# Patient Record
Sex: Female | Born: 1955 | Race: White | Hispanic: No
Health system: Southern US, Academic
[De-identification: ages and names within clinical notes are randomized; demographics above are authoritative.]

## PROBLEM LIST (undated history)

## (undated) DIAGNOSIS — I639 Cerebral infarction, unspecified: Secondary | ICD-10-CM

## (undated) DIAGNOSIS — G43909 Migraine, unspecified, not intractable, without status migrainosus: Secondary | ICD-10-CM

## (undated) DIAGNOSIS — J449 Chronic obstructive pulmonary disease, unspecified: Secondary | ICD-10-CM

## (undated) DIAGNOSIS — I10 Essential (primary) hypertension: Secondary | ICD-10-CM

## (undated) HISTORY — DX: Migraine, unspecified, not intractable, without status migrainosus: G43.909

## (undated) HISTORY — DX: Cerebral infarction, unspecified (CMS HCC): I63.9

## (undated) HISTORY — DX: Essential (primary) hypertension: I10

## (undated) HISTORY — DX: Chronic obstructive pulmonary disease, unspecified (CMS HCC): J44.9

## (undated) NOTE — Telephone Encounter (Signed)
Formatting of this note is different from the original.  Situation:   Received live call from Debbie    Relationship: self  Caller's contact number: [phone]   MyChart Active? No    Caller Concern/Issue &Assessment:     Patient requesting an appt with Karlie for bilateral knee steroid injection.     Patient prefers the [facility] office.     Background:   If existing patient/problem, date of last interaction and type (enter "NA" if not applicable): 12/20/2021    Assessment:   Right knee              Chondromalacia / osteoarthritis              Patellofemoral syndrome              Quad weakness    Left knee              Chondromalacia / osteoarthritis              Patella contusion              Quad weakness    Frequent falls      Plan:   -We discussed diagnosis, prognosis, natural history, and treatment options  -We discussed conservative management versus operative intervention  -Discussed conservative management options with physical therapy, at-home physical therapy exercises, antiinflammatories, corticosteroid injections, and activity modifications  -Discussed importance of and strategies for at-home physical therapy exercises for quad, hamstring, and hip strengthening  -Naprosyn BID with meals for pain/inflammation, adding tylenol as needed - discussed taking consistently for improved effect  -Discussed risks and benefits of corticosteroid injections  -We discussed icing therapy   -Physical Therapy consult with [facility] in [place] for quad, hamstring, and hip strengthening      -Pt is electing to receive bilateral knee CSI and for conservative management that includes PT, HEP's, and anti-inflammatories      Patient elected for continued conservative management with a corticosteroid injection for primary knee osteoarthritis    Recommendation:   Patient aware of appt date,time,and location--CNRV #6    Terance Ice, RN 04/12/2022 12:38 PM   Electronically signed by Terance Ice, RN at 04/12/2022  12:42 PM EDT

---

## 1988-08-17 ENCOUNTER — Other Ambulatory Visit (HOSPITAL_COMMUNITY): Payer: Self-pay | Admitting: OBSTETRICS/GYNECOLOGY

## 2021-03-16 ENCOUNTER — Ambulatory Visit (INDEPENDENT_AMBULATORY_CARE_PROVIDER_SITE_OTHER): Payer: Self-pay | Admitting: NURSE PRACTITIONER

## 2021-03-16 NOTE — Telephone Encounter (Signed)
-----   Message from Zebedee Iba, Ambulatory Care Assistant sent at 03/16/2021  4:22 PM EDT -----  ----- Message from Maisie Fus, CA sent at 03/16/2021  4:16 PM EDT -----  Previous [facility] pt:  Pt was seeing Dr. Campbell Riches for melanoma. She let me schedule an appt with Dr. Vena Austria for her, but she is insistent that someone fill her pain prescription before she runs out this weekend. She is asking for a call ASAP.  Thank you.

## 2021-03-17 ENCOUNTER — Ambulatory Visit (INDEPENDENT_AMBULATORY_CARE_PROVIDER_SITE_OTHER): Payer: Self-pay | Admitting: NURSE PRACTITIONER

## 2021-03-20 NOTE — Telephone Encounter (Signed)
I called and spoke with patient about the pain medication.  She was advised that [facility] only prescribes pain medication for active cancer patient.   She states that she just had surgery on both her legs for skin cancer.   She was advised to speak with the provider who did her surgery removal about pain medication.  She would have to be seen to determine if her pain medication is appropriate but that having a history of Melanoma dose not quailify for pain management.     She was not happy with this.  She was advised to reach out to the provider who did her skin cancer removal.    She had been told at Dr. Tobin Chad office that she would need to find a pain clinic and was given pain clinic packet.

## 2021-03-31 ENCOUNTER — Ambulatory Visit (INDEPENDENT_AMBULATORY_CARE_PROVIDER_SITE_OTHER): Payer: Self-pay | Admitting: Hematology & Oncology

## 2022-04-11 IMAGING — MR MRI BRAIN W/O CONTRAST
9 series · 48 of 48 positions shown · non-contrast
Comparison: None available.

﻿EXAM:  MRI BRAIN W/O CONTRAST,MRI CERVICAL SPINE WITHOUT CONTRAST
INDICATION: Headaches, neck pain.  Left shoulder and arm pain.  Falls.
TECHNIQUE: Noncontrast multiplanar, multisequence MRI was performed. 

FINDINGS BRAIN:  There is mild atrophy and moderate chronic small vessel ischemic change.  
There is no mass, hemorrhage, shift of the midline structures, or extra-axial collection.  
The paranasal sinuses appear clear.  There is minimal fluid in the right mastoid air cells.

[Series 5: DWI · axial · 5.0mm · 1.35mm/px · z∈[-60,+66]mm · 15 of 88 slices shown (1 of 3)]
[im 1/88]
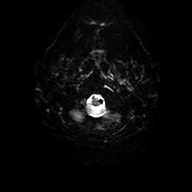
[im 7/88]
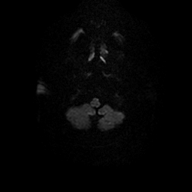
[im 13/88]
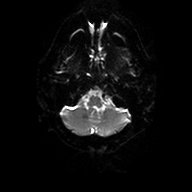
[im 19/88]
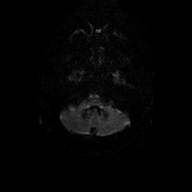
[im 25/88]
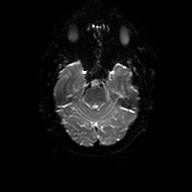
[im 32/88]
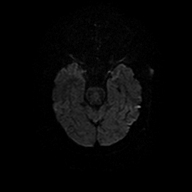
[im 38/88]
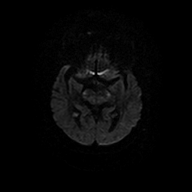
[im 44/88]
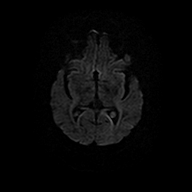
[im 50/88]
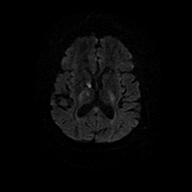
[im 56/88]
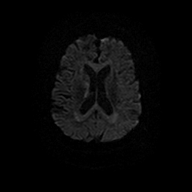
[im 63/88]
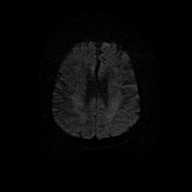
[im 69/88]
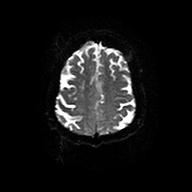
[im 75/88]
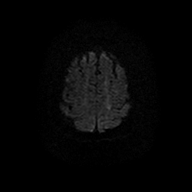
[im 81/88]
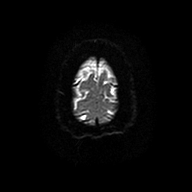
[im 88/88]
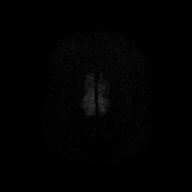

[Series 6: DWI · axial · 5.0mm · 1.35mm/px · z∈[-60,+66]mm · 4 of 22 slices shown (2 of 3)]
[im 1/22]
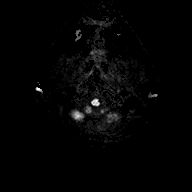
[im 8/22]
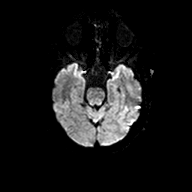
[im 15/22]
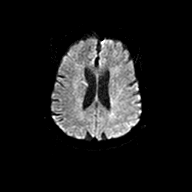
[im 22/22]
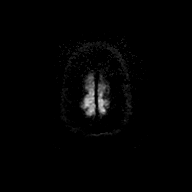

[Series 7: DWI · axial · 5.0mm · 1.35mm/px · z∈[-60,+66]mm · 4 of 22 slices shown (3 of 3)]
[im 1/22]
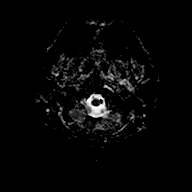
[im 8/22]
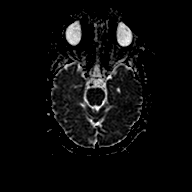
[im 15/22]
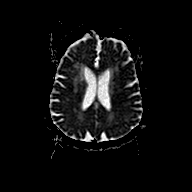
[im 22/22]
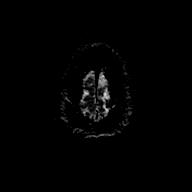

[Series 8: FLAIR · sagittal · 4.0mm · 0.75mm/px · 5 of 26 slices shown (1 of 2)]
[im 1/26]
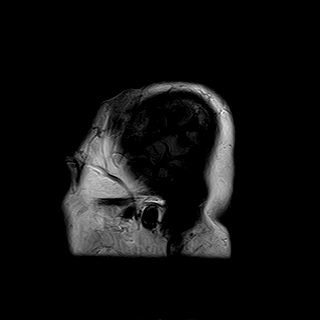
[im 7/26]
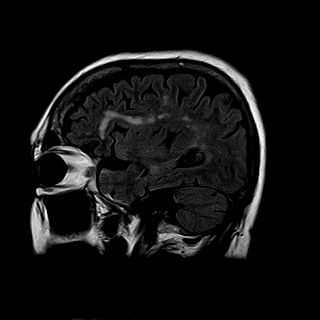
[im 13/26]
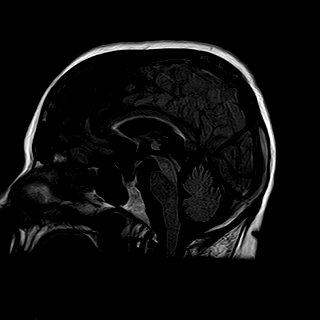
[im 19/26]
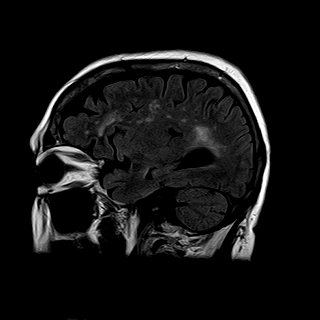
[im 26/26]
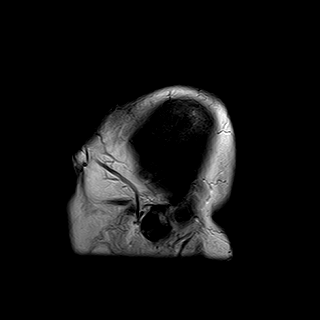

[Series 9: T2 · axial · 5.0mm · 0.57mm/px · z∈[-59,+67]mm · 4 of 22 slices shown (1 of 2)]
[im 1/22]
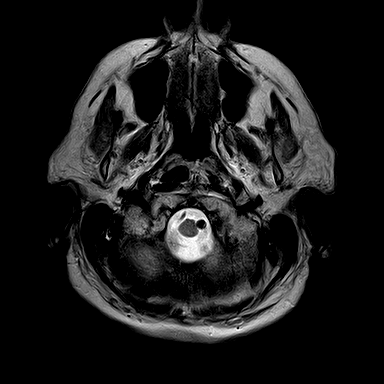
[im 8/22]
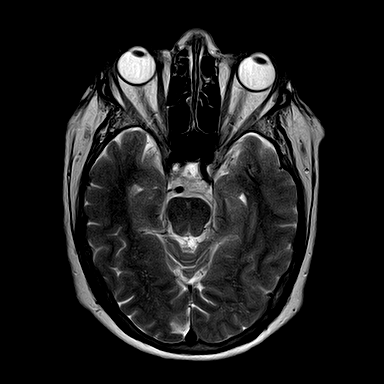
[im 15/22]
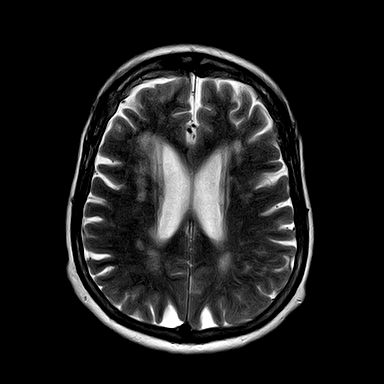
[im 22/22]
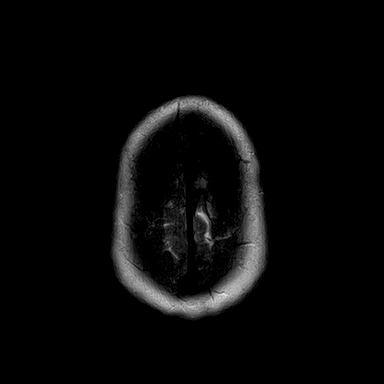

[Series 10: FLAIR · axial · 5.0mm · 0.76mm/px · z∈[-59,+67]mm · 4 of 22 slices shown (2 of 2)]
[im 1/22]
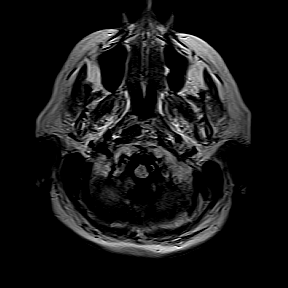
[im 8/22]
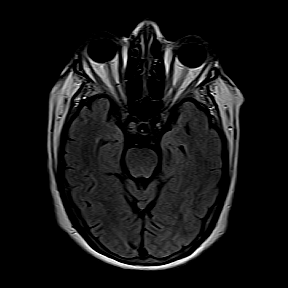
[im 15/22]
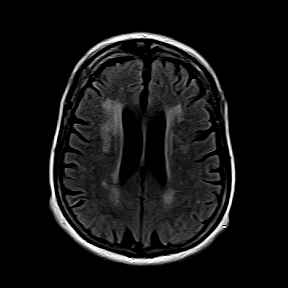
[im 22/22]
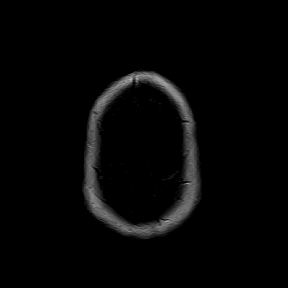

[Series 11: T1 · axial · 5.0mm · 0.69mm/px · z∈[-59,+67]mm · 4 of 22 slices shown]
[im 1/22]
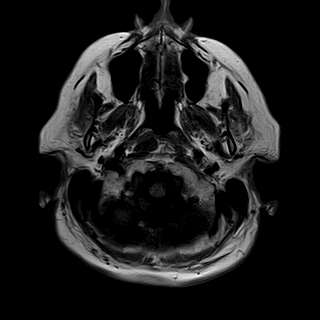
[im 8/22]
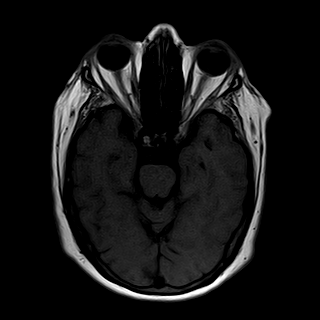
[im 15/22]
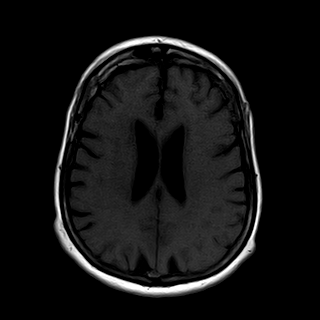
[im 22/22]
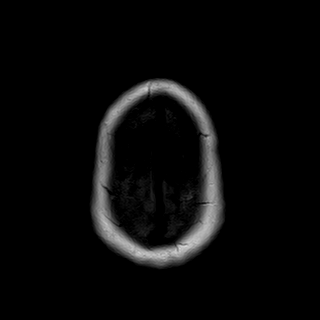

[Series 12: T2-star · axial · 5.0mm · 0.69mm/px · z∈[-59,+67]mm · 4 of 22 slices shown]
[im 1/22]
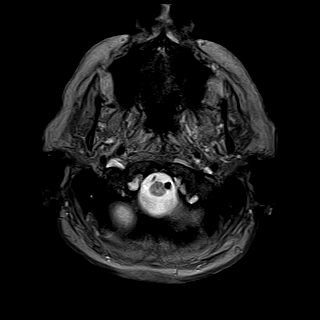
[im 8/22]
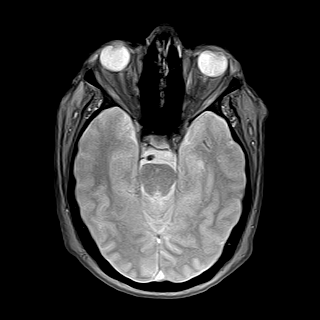
[im 15/22]
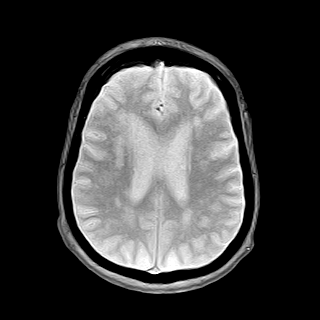
[im 22/22]
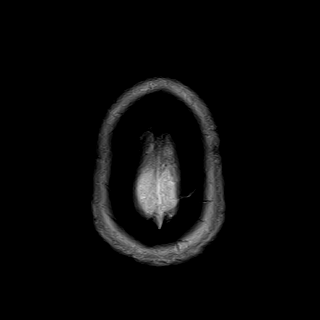

[Series 13: T2 · coronal · 6.0mm · 0.43mm/px · 4 of 24 slices shown (2 of 2)]
[im 1/24]
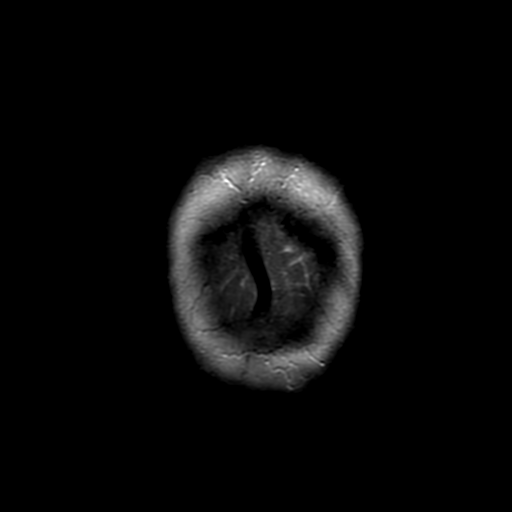
[im 8/24]
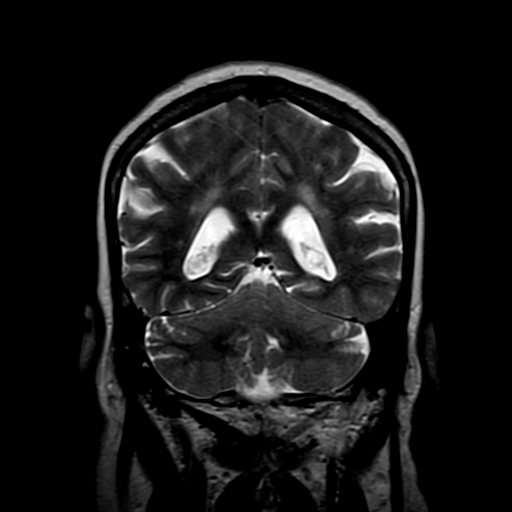
[im 16/24]
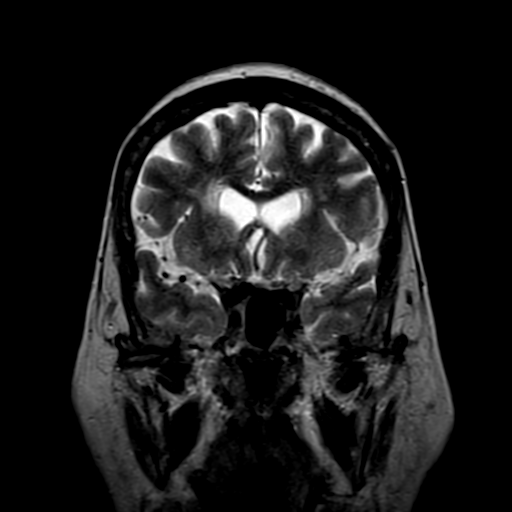
[im 24/24]
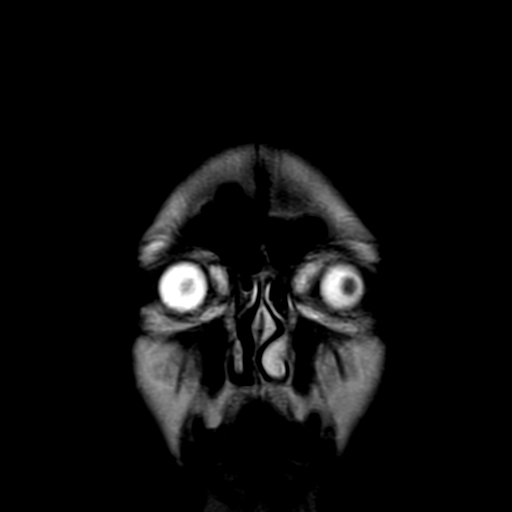

[48 of 48 positions shown; findings below may reference images not displayed]

IMPRESSION: 1. Moderate chronic small vessel ischemic change. 

2. Mild atrophy.  

FINDINGS CERVICAL SPINE:  There are moderate degenerative changes at multiple levels including C4-C5, C5-C6 and C6-C7 with disc space narrowing and osteophyte formation.  There is minimal multilevel disc bulging.  No disc herniation is seen.  

There is no fracture, malalignment, spinal stenosis, significant marrow signal alteration, abnormality of the spinal cord, or a Chiari malformation.
IMPRESSION: 1. Moderate degenerative changes. 

2. Minimal multilevel disc bulging.

## 2022-04-12 IMAGING — MR MRI CERVICAL SPINE WITHOUT CONTRAST
4 of 5 series · 24 of 48 positions shown · non-contrast
Comparison: None available.

﻿EXAM:  MRI BRAIN W/O CONTRAST,MRI CERVICAL SPINE WITHOUT CONTRAST
INDICATION: Headaches, neck pain.  Left shoulder and arm pain.  Falls.
TECHNIQUE: Noncontrast multiplanar, multisequence MRI was performed. 

FINDINGS BRAIN:  There is mild atrophy and moderate chronic small vessel ischemic change.  
There is no mass, hemorrhage, shift of the midline structures, or extra-axial collection.  
The paranasal sinuses appear clear.  There is minimal fluid in the right mastoid air cells.

[Series 14: T2 · sagittal · 3.0mm · 0.62mm/px · 8 of 13 slices shown (1 of 2)]
[im 1/13]
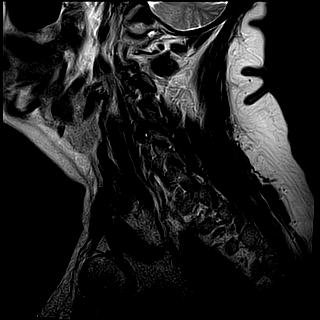
[im 2/13]
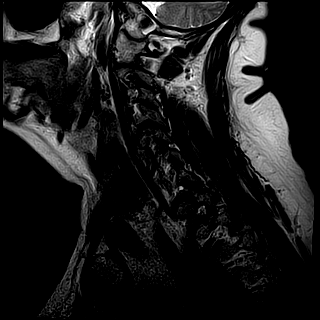
[im 4/13]
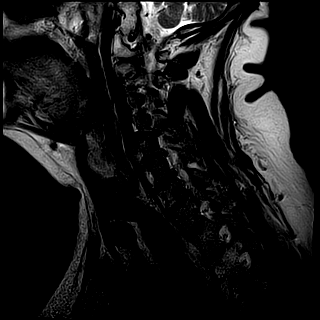
[im 6/13]
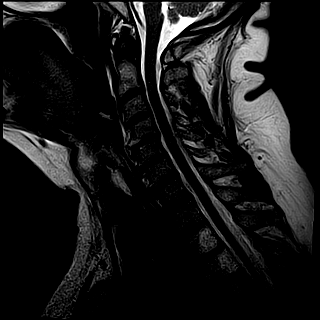
[im 7/13]
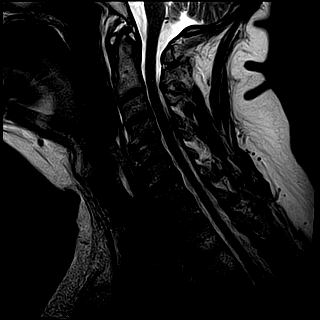
[im 9/13]
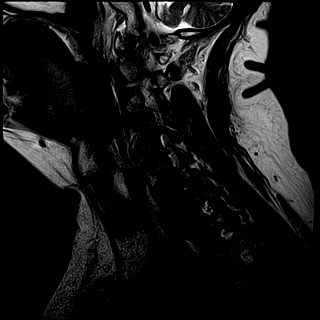
[im 11/13]
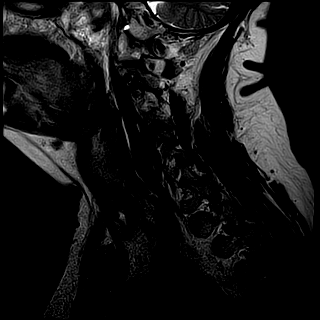
[im 13/13]
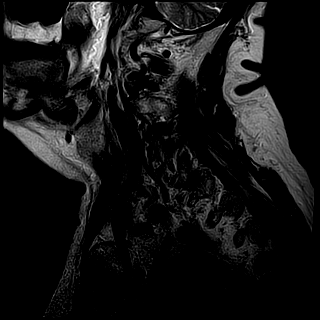

[Series 15: T1 · sagittal · 3.0mm · 0.39mm/px · 4 of 13 slices shown]
[im 1/13]
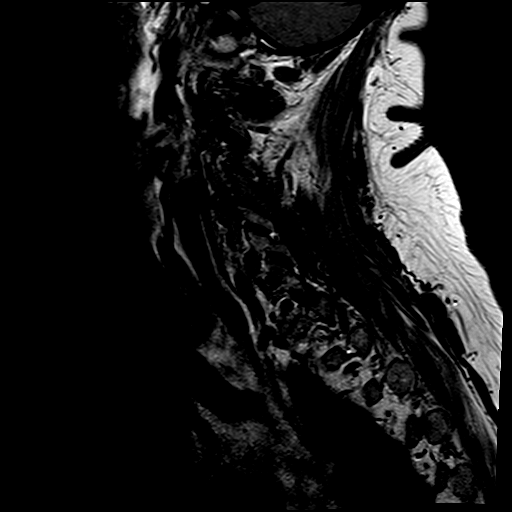
[im 2/13]
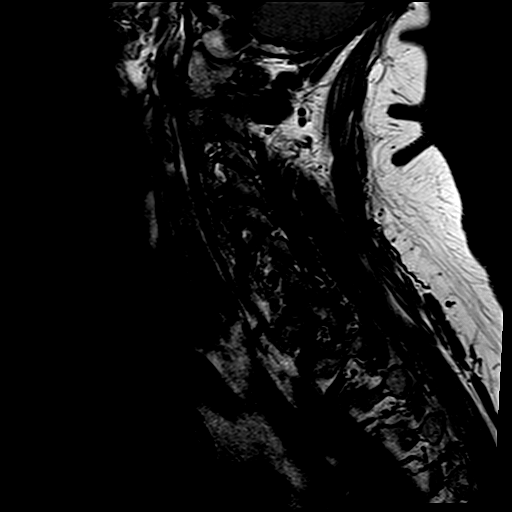
[im 7/13]
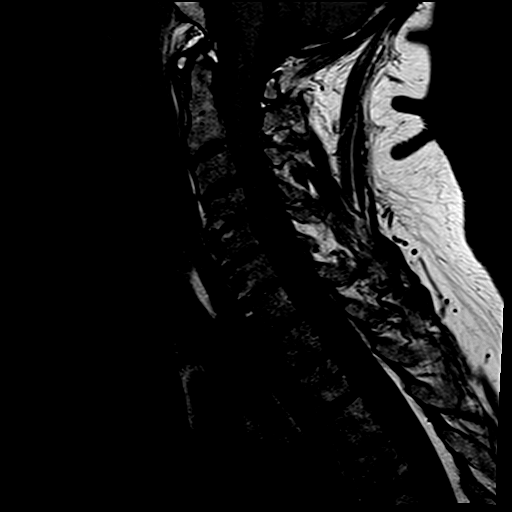
[im 11/13]
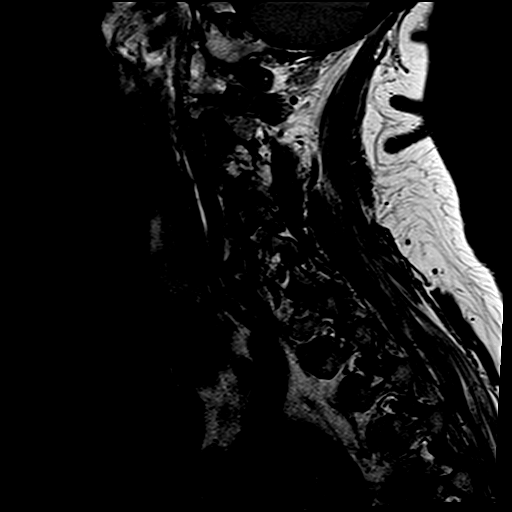

[Series 16: sag (id) · sagittal · 3.0mm · 0.39mm/px · 3 of 13 slices shown]
[im 2/13]
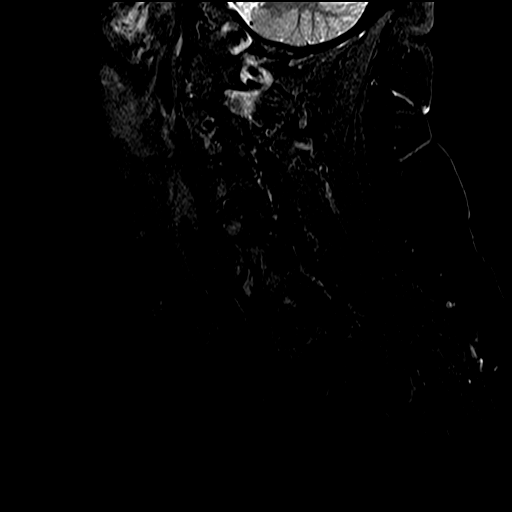
[im 7/13]
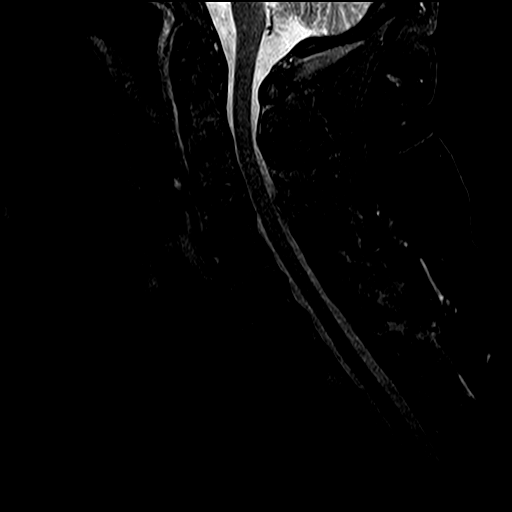
[im 11/13]
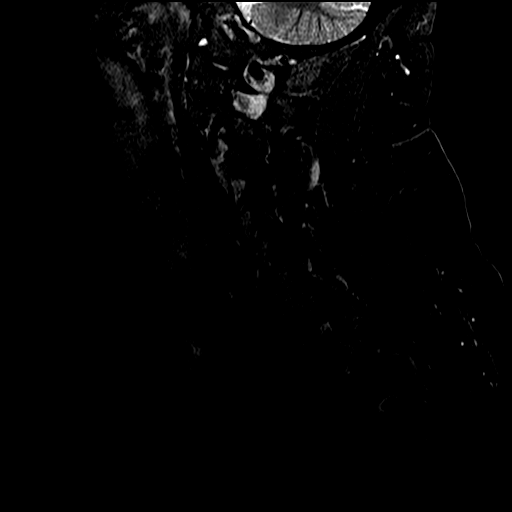

[Series 17: T2 · axial · 3.0mm · 0.39mm/px · z∈[-114,-33]mm · 9 of 18 slices shown (2 of 2)]
[im 1/18]
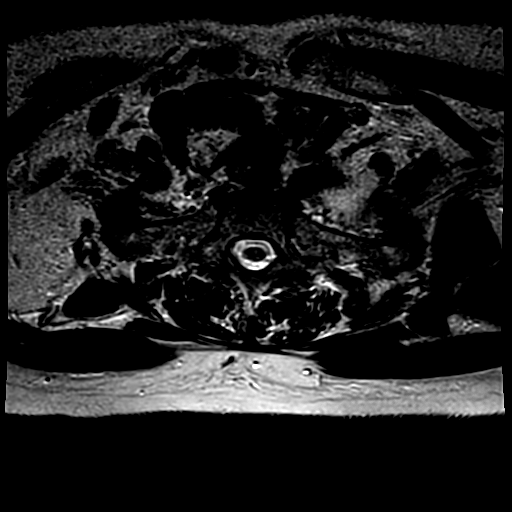
[im 4/18]
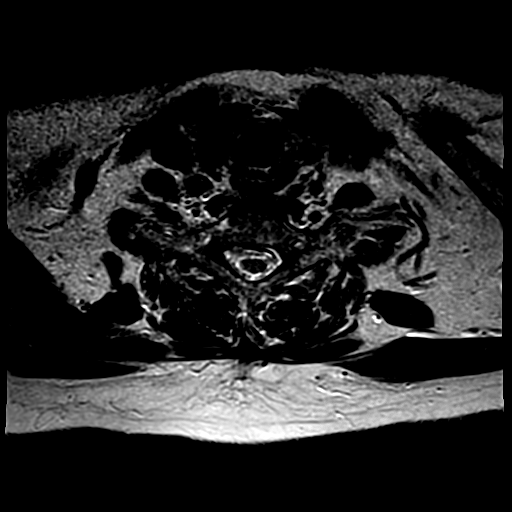
[im 5/18]
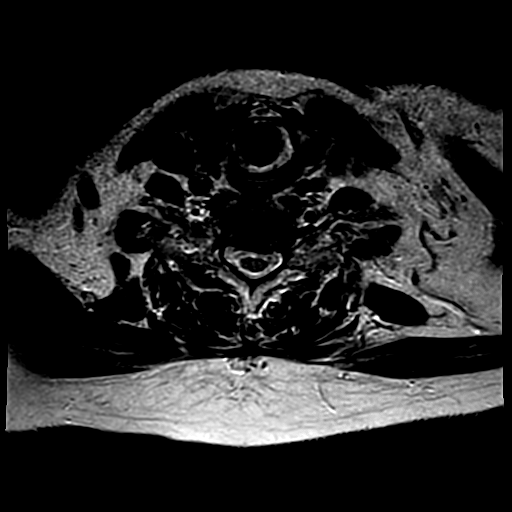
[im 8/18]
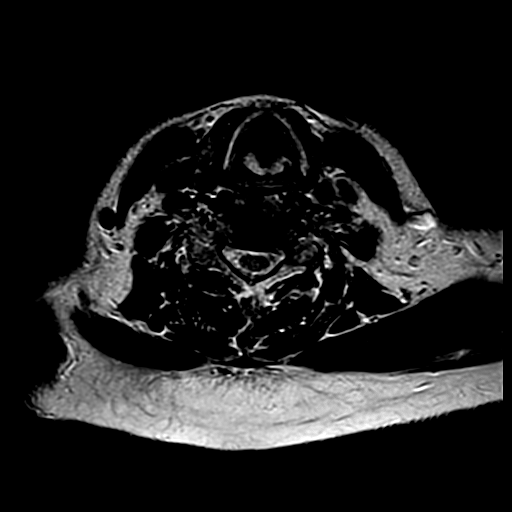
[im 10/18]
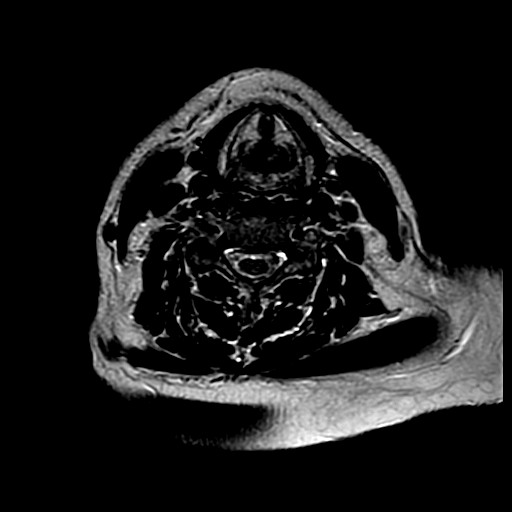
[im 13/18]
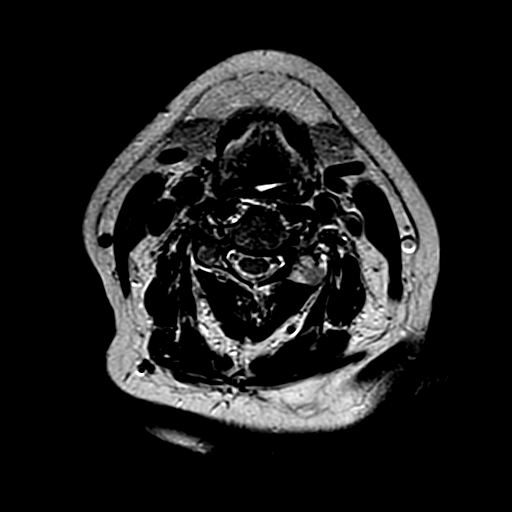
[im 14/18]
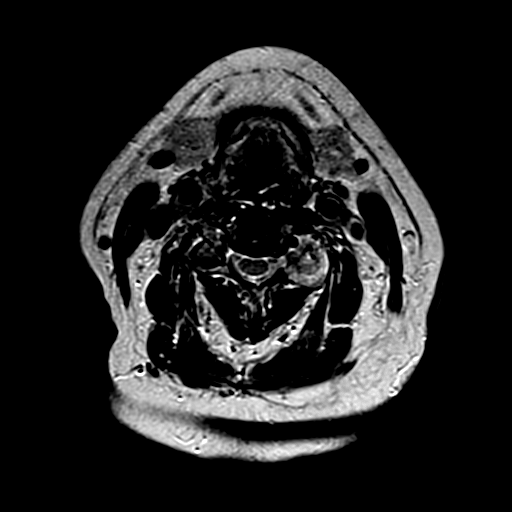
[im 16/18]
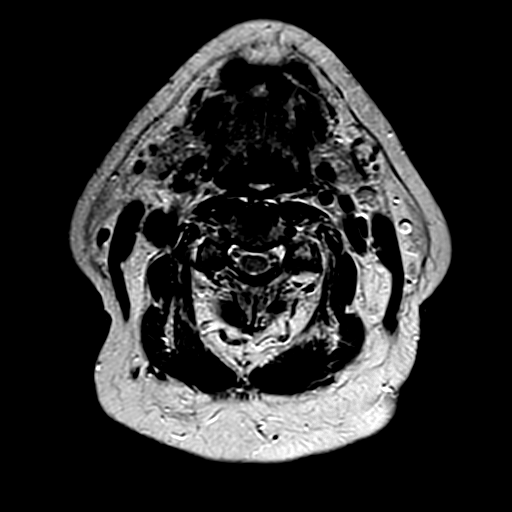
[im 18/18]
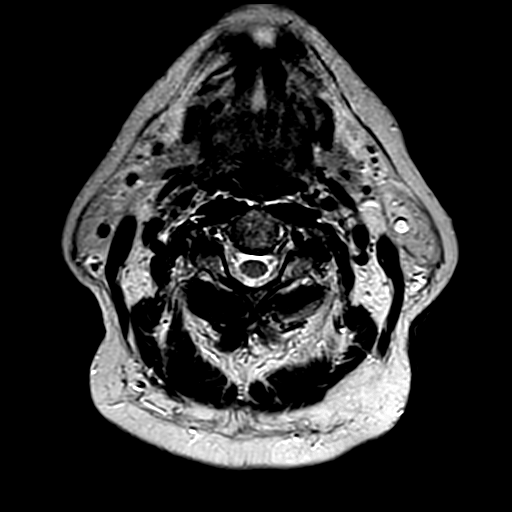

[24 of 48 positions shown; findings below may reference images not displayed]

IMPRESSION: 1. Moderate chronic small vessel ischemic change. 

2. Mild atrophy.  

FINDINGS CERVICAL SPINE:  There are moderate degenerative changes at multiple levels including C4-C5, C5-C6 and C6-C7 with disc space narrowing and osteophyte formation.  There is minimal multilevel disc bulging.  No disc herniation is seen.  

There is no fracture, malalignment, spinal stenosis, significant marrow signal alteration, abnormality of the spinal cord, or a Chiari malformation.
IMPRESSION: 1. Moderate degenerative changes. 

2. Minimal multilevel disc bulging.

## 2022-04-15 ENCOUNTER — Other Ambulatory Visit: Payer: Self-pay

## 2022-04-15 ENCOUNTER — Encounter (HOSPITAL_COMMUNITY): Payer: Self-pay

## 2022-04-15 ENCOUNTER — Emergency Department
Admission: EM | Admit: 2022-04-15 | Discharge: 2022-05-09 | Disposition: E | Payer: Commercial Managed Care - PPO | Attending: Emergency Medicine | Admitting: Emergency Medicine

## 2022-04-15 DIAGNOSIS — I469 Cardiac arrest, cause unspecified: Secondary | ICD-10-CM

## 2022-04-15 MED ORDER — EPINEPHRINE 0.1 MG/ML INJECTION SYRINGE
INJECTION | Freq: Once | INTRAMUSCULAR | Status: AC | PRN
Start: 2022-04-15 — End: 2022-04-15
  Administered 2022-04-15 (×4): 1 mg via INTRAVENOUS

## 2022-05-09 NOTE — Code Documentation (Signed)
ETCO2 monitor placed.

## 2022-05-09 NOTE — ED Nurses Note (Signed)
ME declined, Case [id number]

## 2022-05-09 NOTE — ED Nurses Note (Signed)
Family left, CORE notified at this time.

## 2022-05-09 NOTE — ED Provider Notes (Signed)
Emergency Medicine    Name: Marisa Lane  Age and Gender: 66 y.o. female  Date of Birth: 1955/08/04  MRN: [id number]  PCP: No Pcp    CC:  Chief Complaint   Patient presents with    Cardiac Arrest       HPI:  Marisa Lane is a 66 y.o. White female with cardiac arrest.  EMS was called because this patient was with family and clutched her chest, saying that she was having a heart attack. She collapsed and EMS was called. It is not competely clear what the down time was. It may have been as much as about 30 minutes prior to EMS arrival; or it may have been five minutes before their arrival. She did not receive bystander CPR.      EMS found her in ventricular fibrillation and she was defibrillated but then was in asystole except for one brief episode of v-fib before arrival to the ED. She received epinephrine via IO, 1mg , x 4 total doses.      She was orally intubated by EMS and ventilated with BVM, and was placed on the [place] device.    On arrival she was dusky and receiving mechanical CPR.            Below pertinent information reviewed with patient:  Past Medical History:   Diagnosis Date    COPD (chronic obstructive pulmonary disease) (CMS HCC)     CVA (cerebral vascular accident) (CMS [place])     HTN (hypertension)     Migraines            No Known Allergies          Social History        Objective:    ED Triage Vitals   BP (Non-Invasive) 24-Apr-2022 1230 (!) 66/23   Heart Rate April 24, 2022 1229 (!) 0   Respiratory Rate April 24, 2022 1230 (!) 37   Temp --    SpO2 2022/04/24 1228 91 %   Weight 24-Apr-2022 1229 81.6 kg (180 lb)   Height April 24, 2022 1229 1.702 m (5\' 7" )     Filed Vitals:    04-24-22 1232 April 24, 2022 1233 Apr 24, 2022 1234 2022/04/24 1236   BP:       Pulse: (!) 140 (!) 122 (!) 127 (!) 122   Resp: (!) 24 (!) 119 (!) 42    SpO2:           Nursing notes and vital signs reviewed.  Const:  unresponsive, dusky.  Orally intubated.  HEENT - Normocephalic. Atraumatic. Pupils fixed, dilated.  Neck - Trachea midline. No stridor. No  hoarseness.  Cardiac - No pulse except during CPR.  No cardiac activity by bedside US.  Respiratory - breath sounds clear, symmetric via ETT while bagged.  Abdomen - nondistended  Musculoskeletal - no obvious deformity, skin tear left wrist due to [facility] device, IO line in right tibia  Skin - cool, dusky  Neuro - unresponsive to any stimulus    Any pertinent labs and imaging obtained during this encounter reviewed below in MDM.    MDM/ED Course:    Patient ventilated/oxygenated via BVM with 100% FIO2. ETCO2 no higher than 8,  but SaO2 in the 90's.    Patient received IO epinephrine, 1mg , x 4 in the ED.    Bedside US rechecked and continued to show no cardiac activity.    I spoke with her two sons who arrived and explained her poor prognosis. By that time her downtime with CPR was at  least 35 minutes.      Her sons agreed with withdrawal of care and resuscitation discontinued at 1238.     Sons at bedside.     Pupils fixed and dilated, no heart tones, no respiratory effort, no response to pain or any stimuli.      Medical Decision Making  Risk  Prescription drug management.  Decision not to resuscitate or to de-escalate care because of poor prognosis.  Risk Details: Patient deceased             Orders Placed This Encounter    EPINEPHrine (ADRENALIN) 0.1 mg/mL injection           Impression:   Clinical Impression   Cardiac arrest (CMS HCC) (Primary)   Asystole (CMS HCC)       Disposition: To Morgue          Portions of this note may have been dictated using voice recognition software.     Cruzita Lederer, [facility] ED    -----------------------  No results found for this or any previous visit (from the past 12 hour(s)).  No orders to display

## 2022-05-09 NOTE — ED Nurses Note (Signed)
Core called, states pt is candidate for tissue and cornea. Referral number [id number]

## 2022-05-09 NOTE — ED Nurses Note (Signed)
Sons at bedside with Dr Rosalee Kaufman. Sons verbalize understanding to Dr Rosalee Kaufman explanation of patient condition and treatment. Sons agreeable to stop CPR now. Sons are tearful, but appropriate and held patient's hand while compressions were stopped.

## 2022-05-09 NOTE — Code Documentation (Signed)
EMS states pt had had 2 doses epi, last dose 5 min ago. Pt has had 1 shock d/t vib PTA. Lucas machine providing compressions, to be removed once team ready for pulse check

## 2022-05-09 NOTE — ED Triage Notes (Signed)
Cardiac arrest upon EMS arrival to home.   Daughter reports to EMS that she had said she was having a heart attack and collapsed before coming unresponsive.     PRS: EPI X 4, one shock, IO right tibia, lucas. V fib x 1, ETT 7.5

## 2022-05-09 DEATH — deceased
